# Patient Record
Sex: Female | Born: 2000 | Race: White | Hispanic: No | Marital: Single | Smoking: Never smoker
Health system: Southern US, Community
[De-identification: ages and names within clinical notes are randomized; demographics above are authoritative.]

## PROBLEM LIST (undated history)

## (undated) HISTORY — PX: WISDOM TOOTH EXTRACTION: SHX21

---
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
## 2020-05-19 DIAGNOSIS — J069 Acute upper respiratory infection, unspecified: Secondary | ICD-10-CM | POA: Diagnosis not present

## 2020-05-19 DIAGNOSIS — J029 Acute pharyngitis, unspecified: Secondary | ICD-10-CM | POA: Diagnosis not present

## 2020-05-19 DIAGNOSIS — J4531 Mild persistent asthma with (acute) exacerbation: Secondary | ICD-10-CM | POA: Diagnosis not present

## 2020-05-24 DIAGNOSIS — B9689 Other specified bacterial agents as the cause of diseases classified elsewhere: Secondary | ICD-10-CM | POA: Diagnosis not present

## 2020-05-24 DIAGNOSIS — J019 Acute sinusitis, unspecified: Secondary | ICD-10-CM | POA: Diagnosis not present

## 2020-08-29 DIAGNOSIS — Z1331 Encounter for screening for depression: Secondary | ICD-10-CM | POA: Diagnosis not present

## 2020-08-29 DIAGNOSIS — Z13220 Encounter for screening for lipoid disorders: Secondary | ICD-10-CM | POA: Diagnosis not present

## 2020-08-29 DIAGNOSIS — R109 Unspecified abdominal pain: Secondary | ICD-10-CM | POA: Diagnosis not present

## 2020-08-29 DIAGNOSIS — J45909 Unspecified asthma, uncomplicated: Secondary | ICD-10-CM | POA: Diagnosis not present

## 2020-09-06 ENCOUNTER — Other Ambulatory Visit: Payer: Self-pay

## 2020-09-06 DIAGNOSIS — Z20822 Contact with and (suspected) exposure to covid-19: Secondary | ICD-10-CM

## 2020-09-08 LAB — SARS-COV-2, NAA 2 DAY TAT

## 2020-09-08 LAB — NOVEL CORONAVIRUS, NAA: SARS-CoV-2, NAA: DETECTED — AB

## 2020-12-27 ENCOUNTER — Encounter (INDEPENDENT_AMBULATORY_CARE_PROVIDER_SITE_OTHER): Payer: Self-pay

## 2021-07-30 DIAGNOSIS — R0981 Nasal congestion: Secondary | ICD-10-CM | POA: Diagnosis not present

## 2021-07-30 DIAGNOSIS — J40 Bronchitis, not specified as acute or chronic: Secondary | ICD-10-CM | POA: Diagnosis not present

## 2021-08-27 DIAGNOSIS — K219 Gastro-esophageal reflux disease without esophagitis: Secondary | ICD-10-CM | POA: Diagnosis not present

## 2021-08-27 DIAGNOSIS — R7989 Other specified abnormal findings of blood chemistry: Secondary | ICD-10-CM | POA: Diagnosis not present

## 2021-09-03 DIAGNOSIS — K219 Gastro-esophageal reflux disease without esophagitis: Secondary | ICD-10-CM | POA: Diagnosis not present

## 2021-09-03 DIAGNOSIS — Z1339 Encounter for screening examination for other mental health and behavioral disorders: Secondary | ICD-10-CM | POA: Diagnosis not present

## 2021-09-03 DIAGNOSIS — Z0000 Encounter for general adult medical examination without abnormal findings: Secondary | ICD-10-CM | POA: Diagnosis not present

## 2021-09-03 DIAGNOSIS — Z1331 Encounter for screening for depression: Secondary | ICD-10-CM | POA: Diagnosis not present

## 2021-10-07 DIAGNOSIS — R0981 Nasal congestion: Secondary | ICD-10-CM | POA: Diagnosis not present

## 2021-10-07 DIAGNOSIS — J209 Acute bronchitis, unspecified: Secondary | ICD-10-CM | POA: Diagnosis not present

## 2022-02-05 DIAGNOSIS — H00015 Hordeolum externum left lower eyelid: Secondary | ICD-10-CM | POA: Diagnosis not present

## 2022-05-22 DIAGNOSIS — R058 Other specified cough: Secondary | ICD-10-CM | POA: Diagnosis not present

## 2022-05-22 DIAGNOSIS — R1013 Epigastric pain: Secondary | ICD-10-CM | POA: Diagnosis not present

## 2022-05-23 DIAGNOSIS — M79644 Pain in right finger(s): Secondary | ICD-10-CM | POA: Diagnosis not present

## 2022-06-02 DIAGNOSIS — M79644 Pain in right finger(s): Secondary | ICD-10-CM | POA: Diagnosis not present

## 2022-07-22 DIAGNOSIS — J069 Acute upper respiratory infection, unspecified: Secondary | ICD-10-CM | POA: Diagnosis not present

## 2022-07-22 DIAGNOSIS — R058 Other specified cough: Secondary | ICD-10-CM | POA: Diagnosis not present
